# Patient Record
Sex: Male | Born: 2000 | Race: White | Hispanic: No | Marital: Single | Smoking: Never smoker
Health system: Southern US, Community
[De-identification: ages and names within clinical notes are randomized; demographics above are authoritative.]

---
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
## 2021-10-04 ENCOUNTER — Encounter (HOSPITAL_BASED_OUTPATIENT_CLINIC_OR_DEPARTMENT_OTHER): Payer: Self-pay | Admitting: Obstetrics and Gynecology

## 2021-10-04 ENCOUNTER — Emergency Department (HOSPITAL_BASED_OUTPATIENT_CLINIC_OR_DEPARTMENT_OTHER)
Admission: EM | Admit: 2021-10-04 | Discharge: 2021-10-04 | Disposition: A | Discharge status: Home / Self Care | Payer: BC Managed Care – PPO | Attending: Emergency Medicine | Admitting: Emergency Medicine

## 2021-10-04 ENCOUNTER — Emergency Department (HOSPITAL_BASED_OUTPATIENT_CLINIC_OR_DEPARTMENT_OTHER): Payer: BC Managed Care – PPO | Admitting: Radiology

## 2021-10-04 ENCOUNTER — Other Ambulatory Visit: Payer: Self-pay

## 2021-10-04 DIAGNOSIS — M533 Sacrococcygeal disorders, not elsewhere classified: Secondary | ICD-10-CM | POA: Diagnosis not present

## 2021-10-04 DIAGNOSIS — S30810A Abrasion of lower back and pelvis, initial encounter: Secondary | ICD-10-CM | POA: Diagnosis not present

## 2021-10-04 DIAGNOSIS — T148XXA Other injury of unspecified body region, initial encounter: Secondary | ICD-10-CM

## 2021-10-04 DIAGNOSIS — S80812A Abrasion, left lower leg, initial encounter: Secondary | ICD-10-CM | POA: Diagnosis not present

## 2021-10-04 DIAGNOSIS — F17210 Nicotine dependence, cigarettes, uncomplicated: Secondary | ICD-10-CM | POA: Insufficient documentation

## 2021-10-04 DIAGNOSIS — Y92488 Other paved roadways as the place of occurrence of the external cause: Secondary | ICD-10-CM | POA: Insufficient documentation

## 2021-10-04 MED ORDER — CEPHALEXIN 500 MG PO CAPS: 500.0000 mg | ORAL_CAPSULE | Freq: Two times a day (BID) | ORAL | 0 refills | Status: DC

## 2021-10-04 MED ORDER — OXYCODONE-ACETAMINOPHEN 5-325 MG PO TABS: 1.0000 | ORAL_TABLET | Freq: Four times a day (QID) | ORAL | 0 refills | Status: AC | PRN

## 2021-10-04 MED ORDER — CEPHALEXIN 500 MG PO CAPS: 500.0000 mg | ORAL_CAPSULE | Freq: Two times a day (BID) | ORAL | 0 refills | Status: AC

## 2021-10-04 MED ORDER — OXYCODONE-ACETAMINOPHEN 5-325 MG PO TABS
1.0000 | ORAL_TABLET | ORAL | Status: DC | PRN
  Administered 2021-10-04: 1 via ORAL
  Filled 2021-10-04: qty 1

## 2021-10-04 MED ORDER — CEPHALEXIN 250 MG PO CAPS: 500.0000 mg | ORAL_CAPSULE | Freq: Once | ORAL | Status: DC
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
## 2022-03-22 ENCOUNTER — Emergency Department (HOSPITAL_BASED_OUTPATIENT_CLINIC_OR_DEPARTMENT_OTHER): Payer: BC Managed Care – PPO | Admitting: Radiology

## 2022-03-22 ENCOUNTER — Other Ambulatory Visit: Payer: Self-pay

## 2022-03-22 ENCOUNTER — Encounter (HOSPITAL_BASED_OUTPATIENT_CLINIC_OR_DEPARTMENT_OTHER): Payer: Self-pay | Admitting: Emergency Medicine

## 2022-03-22 ENCOUNTER — Emergency Department (HOSPITAL_BASED_OUTPATIENT_CLINIC_OR_DEPARTMENT_OTHER)
Admission: EM | Admit: 2022-03-22 | Discharge: 2022-03-22 | Disposition: A | Discharge status: Home / Self Care | Payer: BC Managed Care – PPO | Attending: Emergency Medicine | Admitting: Emergency Medicine

## 2022-03-22 DIAGNOSIS — S9031XA Contusion of right foot, initial encounter: Secondary | ICD-10-CM | POA: Diagnosis not present

## 2022-03-22 DIAGNOSIS — M7989 Other specified soft tissue disorders: Secondary | ICD-10-CM | POA: Diagnosis not present

## 2022-03-22 DIAGNOSIS — S99921A Unspecified injury of right foot, initial encounter: Secondary | ICD-10-CM | POA: Diagnosis not present

## 2022-03-22 MED ORDER — IBUPROFEN 800 MG PO TABS: 800.0000 mg | ORAL_TABLET | Freq: Three times a day (TID) | ORAL | 0 refills | Status: AC

## 2022-03-22 MED ORDER — ACETAMINOPHEN 325 MG PO TABS: 650.0000 mg | ORAL_TABLET | Freq: Four times a day (QID) | ORAL | 0 refills | Status: AC | PRN

## 2022-03-22 MED ORDER — OXYCODONE HCL 5 MG PO TABS: 5.0000 mg | ORAL_TABLET | ORAL | 0 refills | Status: AC | PRN

## 2022-03-22 MED ORDER — HYDROCODONE-ACETAMINOPHEN 5-325 MG PO TABS
1.0000 | ORAL_TABLET | Freq: Once | ORAL | Status: AC
  Administered 2022-03-22: 1 via ORAL
  Filled 2022-03-22: qty 1
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
## 2022-03-22 NOTE — Discharge Instructions (Addendum)
It was a pleasure caring for you today in the emergency department. ° °Please return to the emergency department for any worsening or worrisome symptoms. ° ° °

## 2022-03-22 NOTE — ED Notes (Signed)
W
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
## 2022-03-22 NOTE — ED Notes (Signed)
Dc instructions reviewed with patient. Patient voiced understanding. Dc with belongings.  °

## 2022-03-22 NOTE — ED Notes (Signed)
Pt was able to walk around with post op shoe. Pt did state that it was painful to apply pressure but would be able to do it. ?

## 2022-03-28 DIAGNOSIS — M25571 Pain in right ankle and joints of right foot: Secondary | ICD-10-CM | POA: Diagnosis not present

## 2023-02-10 DIAGNOSIS — I10 Essential (primary) hypertension: Secondary | ICD-10-CM | POA: Diagnosis not present

## 2023-02-10 DIAGNOSIS — F419 Anxiety disorder, unspecified: Secondary | ICD-10-CM | POA: Diagnosis not present

## 2023-02-10 DIAGNOSIS — G43009 Migraine without aura, not intractable, without status migrainosus: Secondary | ICD-10-CM | POA: Diagnosis not present

## 2023-02-10 DIAGNOSIS — R5383 Other fatigue: Secondary | ICD-10-CM | POA: Diagnosis not present

## 2023-03-02 ENCOUNTER — Encounter (HOSPITAL_COMMUNITY): Payer: Self-pay

## 2023-03-02 ENCOUNTER — Emergency Department (HOSPITAL_COMMUNITY)
Admission: EM | Admit: 2023-03-02 | Discharge: 2023-03-02 | Disposition: A | Discharge status: Home / Self Care | Payer: BC Managed Care – PPO | Attending: Emergency Medicine | Admitting: Emergency Medicine

## 2023-03-02 ENCOUNTER — Other Ambulatory Visit: Payer: Self-pay

## 2023-03-02 ENCOUNTER — Emergency Department (HOSPITAL_COMMUNITY): Payer: BC Managed Care – PPO

## 2023-03-02 DIAGNOSIS — K2920 Alcoholic gastritis without bleeding: Secondary | ICD-10-CM

## 2023-03-02 DIAGNOSIS — R109 Unspecified abdominal pain: Secondary | ICD-10-CM | POA: Diagnosis not present

## 2023-03-02 DIAGNOSIS — N3289 Other specified disorders of bladder: Secondary | ICD-10-CM | POA: Diagnosis not present

## 2023-03-02 LAB — URINALYSIS, ROUTINE W REFLEX MICROSCOPIC
Bilirubin Urine: NEGATIVE
Glucose, UA: NEGATIVE mg/dL
Hgb urine dipstick: NEGATIVE
Ketones, ur: NEGATIVE mg/dL
Leukocytes,Ua: NEGATIVE
Nitrite: NEGATIVE
Protein, ur: NEGATIVE mg/dL
Specific Gravity, Urine: <1.005 — ABNORMAL LOW (ref 1.005–1.030)
pH: 7 (ref 5.0–8.0)

## 2023-03-02 LAB — COMPREHENSIVE METABOLIC PANEL
ALT: 43 U/L (ref 0–44)
AST: 38 U/L (ref 15–41)
Albumin: 4.6 g/dL (ref 3.5–5.0)
Alkaline Phosphatase: 105 U/L (ref 38–126)
Anion gap: 11 (ref 5–15)
BUN: 11 mg/dL (ref 6–20)
CO2: 23 mmol/L (ref 22–32)
Calcium: 9.4 mg/dL (ref 8.9–10.3)
Chloride: 100 mmol/L (ref 98–111)
Creatinine, Ser: 0.93 mg/dL (ref 0.61–1.24)
GFR, Estimated: >60 mL/min (ref >60)
Glucose, Bld: 107 mg/dL — ABNORMAL HIGH (ref 70–99)
Potassium: 3.9 mmol/L (ref 3.5–5.1)
Sodium: 134 mmol/L — ABNORMAL LOW (ref 135–145)
Total Bilirubin: 0.4 mg/dL (ref 0.3–1.2)
Total Protein: 8.5 g/dL — ABNORMAL HIGH (ref 6.5–8.1)

## 2023-03-02 LAB — CBC WITH DIFFERENTIAL/PLATELET
Abs Immature Granulocytes: 0.03 10*3/uL (ref 0.00–0.07)
Basophils Absolute: 0.1 10*3/uL (ref 0.0–0.1)
Basophils Relative: 1 %
Eosinophils Absolute: 0.3 10*3/uL (ref 0.0–0.5)
Eosinophils Relative: 3 %
HCT: 48.6 % (ref 39.0–52.0)
Hemoglobin: 15.9 g/dL (ref 13.0–17.0)
Immature Granulocytes: 0 %
Lymphocytes Relative: 18 %
Lymphs Abs: 1.9 10*3/uL (ref 0.7–4.0)
MCH: 28.3 pg (ref 26.0–34.0)
MCHC: 32.7 g/dL (ref 30.0–36.0)
MCV: 86.5 fL (ref 80.0–100.0)
Monocytes Absolute: 0.8 10*3/uL (ref 0.1–1.0)
Monocytes Relative: 8 %
Neutro Abs: 7.2 10*3/uL (ref 1.7–7.7)
Neutrophils Relative %: 70 %
Platelets: 307 10*3/uL (ref 150–400)
RBC: 5.62 MIL/uL (ref 4.22–5.81)
RDW: 13.7 % (ref 11.5–15.5)
WBC: 10.3 10*3/uL (ref 4.0–10.5)
nRBC: 0 % (ref 0.0–0.2)

## 2023-03-02 LAB — LIPASE, BLOOD: Lipase: 46 U/L (ref 11–51)

## 2023-03-02 MED ORDER — FAMOTIDINE 20 MG PO TABS: 20.0000 mg | ORAL_TABLET | Freq: Two times a day (BID) | ORAL | 0 refills | Status: AC

## 2023-03-02 MED ORDER — ONDANSETRON 4 MG PO TBDP: 4.0000 mg | ORAL_TABLET | Freq: Three times a day (TID) | ORAL | 1 refills | Status: AC | PRN

## 2023-03-02 MED ORDER — LIDOCAINE VISCOUS HCL 2 % MT SOLN
15.0000 mL | Freq: Once | OROMUCOSAL | Status: AC
  Administered 2023-03-02: 15 mL via ORAL
  Filled 2023-03-02: qty 15

## 2023-03-02 MED ORDER — MORPHINE SULFATE (PF) 2 MG/ML IV SOLN
2.0000 mg | Freq: Once | INTRAVENOUS | Status: AC
  Administered 2023-03-02: 2 mg via INTRAVENOUS
  Filled 2023-03-02: qty 1

## 2023-03-02 MED ORDER — PANTOPRAZOLE SODIUM 40 MG IV SOLR
40.0000 mg | Freq: Once | INTRAVENOUS | Status: AC
  Administered 2023-03-02: 40 mg via INTRAVENOUS
  Filled 2023-03-02: qty 10

## 2023-03-02 MED ORDER — KETOROLAC TROMETHAMINE 15 MG/ML IJ SOLN
15.0000 mg | Freq: Once | INTRAMUSCULAR | Status: AC
  Administered 2023-03-02: 15 mg via INTRAVENOUS
  Filled 2023-03-02: qty 1

## 2023-03-02 MED ORDER — IOHEXOL 300 MG/ML  SOLN
100.0000 mL | Freq: Once | INTRAMUSCULAR | Status: AC | PRN
  Administered 2023-03-02: 100 mL via INTRAVENOUS

## 2023-03-02 MED ORDER — ALUM & MAG HYDROXIDE-SIMETH 200-200-20 MG/5ML PO SUSP
30.0000 mL | Freq: Once | ORAL | Status: AC
  Administered 2023-03-02: 30 mL via ORAL
  Filled 2023-03-02: qty 30

## 2023-03-02 MED ORDER — ACETAMINOPHEN 500 MG PO TABS
1000.0000 mg | ORAL_TABLET | Freq: Once | ORAL | Status: AC
  Administered 2023-03-02: 1000 mg via ORAL
  Filled 2023-03-02: qty 2

## 2023-03-02 NOTE — ED Provider Notes (Signed)
[facility] Provider Note   CSN: [id number] Arrival date & time: 03/02/23  9629     History  Chief Complaint  Patient presents with   Abdominal Pain   Emesis    Isaac Romero is a 22 y.o. male with eustachian tube dysfunction, chronic pansinusitis who presents with abd pain.   Pt presents with c/o vomiting and abdominal pain. Pt reports that he normally drinks every day and he stopped several days ago, one day prior to when he began feeling sick.  He has diffuse abdominal pain located in bilateral lower quadrants but left possibly greater than right.  He has had no associated urinary symptoms, no diarrhea/constipation, no melena/hematochezia.  No fever/chills.  Does not notice that the pain is worse with eating.  No radiation of the pain to his back.  No history of similar pain.    Abdominal Pain Associated symptoms: vomiting   Emesis Associated symptoms: abdominal pain        Home Medications Prior to Admission medications   Medication Sig Start Date End Date Taking? Authorizing Provider  famotidine (PEPCID) 20 MG tablet Take 1 tablet (20 mg total) by mouth 2 (two) times daily for 7 days. 03/02/23 03/09/23 Yes Fredia Sorrow, MD  ondansetron (ZOFRAN-ODT) 4 MG disintegrating tablet Take 1 tablet (4 mg total) by mouth every 8 (eight) hours as needed for nausea or vomiting. 03/02/23  Yes Fredia Sorrow, MD  acetaminophen (TYLENOL) 325 MG tablet Take 2 tablets (650 mg total) by mouth every 6 (six) hours as needed. 03/22/22   Jeanell Sparrow, DO  clotrimazole (MYCELEX) 10 MG troche Take 1 tablet (10 mg total) by mouth 5 (five) times daily. Dissolve on tongue 5x daily for 7 days 12/27/15   Harlene Salts, MD  ibuprofen (ADVIL) 800 MG tablet Take 1 tablet (800 mg total) by mouth 3 (three) times daily. 03/22/22   Jeanell Sparrow, DO  oxyCODONE (ROXICODONE) 5 MG immediate release tablet Take 1 tablet (5 mg total) by mouth every 4 (four) hours as needed  for up to 5 doses for severe pain or breakthrough pain. 03/22/22   Jeanell Sparrow, DO  oxyCODONE-acetaminophen (PERCOCET/ROXICET) 5-325 MG tablet Take 1 tablet by mouth every 6 (six) hours as needed for up to 15 doses for severe pain. 10/04/21   Lennice Sites, DO      Allergies    Patient has no known allergies.    Review of Systems   Review of Systems  Gastrointestinal:  Positive for abdominal pain and vomiting.   Review of systems Negative for f/c.  A 10 point review of systems was performed and is negative unless otherwise reported in HPI.  Physical Exam Updated Vital Signs BP 138/82 (BP Location: Right Arm)   Pulse 60   Temp 98.5 F (36.9 C) (Oral)   Resp 18   SpO2 99%  Physical Exam General: Normal appearing male, lying in bed.  HEENT: Sclera anicteric, MMM, trachea midline.  Cardiology: RRR, no murmurs/rubs/gallops. BL radial and DP pulses equal bilaterally.  Resp: Normal respiratory rate and effort. CTAB, no wheezes, rhonchi, crackles.  Abd: Mildly diffusely tender in lower quadrants, L>R; mild epigastric TTP. Soft,  non-distended. No rebound tenderness or guarding.  GU: Deferred. MSK: No peripheral edema or signs of trauma. Extremities without deformity or TTP. No cyanosis or clubbing. Skin: warm, dry. No rashes or lesions. Back: No CVA tenderness Neuro: A&Ox4, CNs II-XII grossly intact. MAEs. Sensation grossly intact.  Psych:  Normal mood and affect.   ED Results / Procedures / Treatments   Labs (all labs ordered are listed, but only abnormal results are displayed) Labs Reviewed  COMPREHENSIVE METABOLIC PANEL - Abnormal; Notable for the following components:      Result Value   Sodium 134 (*)    Glucose, Bld 107 (*)    Total Protein 8.5 (*)    All other components within normal limits  URINALYSIS, ROUTINE W REFLEX MICROSCOPIC - Abnormal; Notable for the following components:   Specific Gravity, Urine <1.005 (*)    All other components within normal limits  CBC  WITH DIFFERENTIAL/PLATELET  LIPASE, BLOOD    EKG None  Radiology CT A/P Pending  Procedures Procedures    Medications Ordered in ED Medications  alum & mag hydroxide-simeth (MAALOX/MYLANTA) 200-200-20 MG/5ML suspension 30 mL (30 mLs Oral Given 03/02/23 1003)    And  lidocaine (XYLOCAINE) 2 % viscous mouth solution 15 mL (15 mLs Oral Given 03/02/23 1003)  acetaminophen (TYLENOL) tablet 1,000 mg (1,000 mg Oral Given 03/02/23 1003)  morphine (PF) 2 MG/ML injection 2 mg (2 mg Intravenous Given 03/02/23 1104)  ketorolac (TORADOL) 15 MG/ML injection 15 mg (15 mg Intravenous Given 03/02/23 1103)  iohexol (OMNIPAQUE) 300 MG/ML solution 100 mL (100 mLs Intravenous Contrast Given 03/02/23 1502)  pantoprazole (PROTONIX) injection 40 mg (40 mg Intravenous Given 03/02/23 1552)    ED Course/ Medical Decision Making/ A&P                          Medical Decision Making Amount and/or Complexity of Data Reviewed Labs: ordered. Decision-making details documented in ED Course. Radiology: ordered. Decision-making details documented in ED Course.  Risk OTC drugs. Prescription drug management.    This patient presents to the ED for concern of abdominal pain; this involves an extensive number of treatment options, and is a complaint that carries with it a high risk of complications and morbidity.  I considered the following differential and admission for this acute, potentially life threatening condition.   MDM:    For DDX for abdominal pain includes but is not limited to:  Abdominal exam without peritoneal signs. No evidence of acute abdomen at this time. Low suspicion for acute hepatobiliary disease (including acute cholecystitis or cholangitis) with no RUQ TTP. Consider acute pancreatitis given h/o EtOH use; also consider PUD/alcoholic gastritis. Lower c/f acute appendicitis, vascular catastrophe, bowel obstruction, viscus perforation, diverticulitis given patient's lack of findings of acute abdomen on  exam, age, and overall well-appearing, otherwise healthy patient. Will start with labs and pain medications and reassess.    Clinical Course as of 03/07/23 1305  Thu Mar 02, 2023  1027 Lipase: 46 Neg [HN]  1027 Comprehensive metabolic panel(!) Largely unremarkalbe, very mild hyponatremia [HN]  1027 CBC with Differential wnl [HN]  1310 D/w patient who still has persistent LLQ pain and relays that he was concenred about possible UC. States his father has a diagnosis of UC and that was why he came in. Does request a CT scan. Will perform CT scan w/ contrast.  [HN]  1538 CT ABDOMEN PELVIS W CONTRAST 1. Mild wall thickening versus underdistention of the gastric antrum. Correlate for gastritis. 2. Prominent ileocolic lymph nodes measure up to 6 mm in short axis, nonspecific favored reactive but can be seen in the setting of mesenteric adenitis. 3. Mild symmetric wall thickening of a nondistended urinary bladder, which may be due to underdistention but can also be  seen in the setting of cystitis. Correlate with urinalysis.   [HN]  1602 Patient is given IV protonix for likely alcoholic gastritis. CT also demonstrated c/f cystitis, will get UA. Patient is signed out to the oncoming ED physician Dr. Rogene Houston who is made aware of his history, presentation, exam, workup, and plan.   [HN]    Clinical Course User Index [HN] Audley Hose, MD    Labs: I Ordered, and personally interpreted labs.  The pertinent results include: Those listed above  Imaging Studies ordered: I ordered imaging studies including CT abdomen pelvis I independently visualized and interpreted imaging. I agree with the radiologist interpretation  Additional history obtained from mother at bedside.   Reevaluation: After the interventions noted above, I reevaluated the patient and found that they have :improved  Social Determinants of Health: Patient lives independently   Disposition:  Likely DC once UA  returns.  Co morbidities that complicate the patient evaluation History reviewed. No pertinent past medical history.   Medicines Meds ordered this encounter  Medications   AND Linked Order Group    alum & mag hydroxide-simeth (MAALOX/MYLANTA) 200-200-20 MG/5ML suspension 30 mL    lidocaine (XYLOCAINE) 2 % viscous mouth solution 15 mL   acetaminophen (TYLENOL) tablet 1,000 mg   morphine (PF) 2 MG/ML injection 2 mg   ketorolac (TORADOL) 15 MG/ML injection 15 mg   iohexol (OMNIPAQUE) 300 MG/ML solution 100 mL   pantoprazole (PROTONIX) injection 40 mg   famotidine (PEPCID) 20 MG tablet    Sig: Take 1 tablet (20 mg total) by mouth 2 (two) times daily for 7 days.    Dispense:  14 tablet    Refill:  0   ondansetron (ZOFRAN-ODT) 4 MG disintegrating tablet    Sig: Take 1 tablet (4 mg total) by mouth every 8 (eight) hours as needed for nausea or vomiting.    Dispense:  12 tablet    Refill:  1    I have reviewed the patients home medicines and have made adjustments as needed  Problem List / ED Course: Problem List Items Addressed This Visit   None Visit Diagnoses     Acute alcoholic gastritis without hemorrhage    -  Primary                   This note was created using dictation software, which may contain spelling or grammatical errors.    Audley Hose, MD 03/07/23 1308

## 2023-03-02 NOTE — ED Provider Notes (Addendum)
Urinalysis negative for urinary tract infection.  Patient stable for discharge home.  Patient states he is feeling better.  But not completely back to normal.  Will treat with Zofran ODT and have him continue with Pepcid for the possible alcohol gastritis.   At discharge patient was requesting narcotic pain medicine.  Do not feel that is appropriate for his presentation.  Did recommend extra strength Tylenol.  If patient takes the Pepcid as directed should help improve the inflammation in the abdomen and should help with the pain.  Fredia Sorrow, MD 03/02/23 1759    Fredia Sorrow, MD 03/02/23 1801    Fredia Sorrow, MD 03/02/23 1827

## 2023-03-02 NOTE — ED Triage Notes (Signed)
Pt presents with c/o vomiting and abdominal pain. Pt reports that he normally drinks every day and he stopped several days ago, one day prior to when he began feeling sick.

## 2023-03-02 NOTE — Discharge Instructions (Addendum)
Urinalysis negative.  No signs of urinary tract infection.  Take the Zofran as needed for nausea vomiting.  Take the Pepcid for the next 7 days.  Return for any new or worse symptoms.

## 2023-03-09 DIAGNOSIS — G4700 Insomnia, unspecified: Secondary | ICD-10-CM | POA: Diagnosis not present

## 2023-03-09 DIAGNOSIS — G4719 Other hypersomnia: Secondary | ICD-10-CM | POA: Diagnosis not present

## 2023-03-21 DIAGNOSIS — F419 Anxiety disorder, unspecified: Secondary | ICD-10-CM | POA: Diagnosis not present

## 2023-03-21 DIAGNOSIS — I10 Essential (primary) hypertension: Secondary | ICD-10-CM | POA: Diagnosis not present

## 2023-03-21 DIAGNOSIS — F331 Major depressive disorder, recurrent, moderate: Secondary | ICD-10-CM | POA: Diagnosis not present

## 2023-03-21 DIAGNOSIS — R000 Tachycardia, unspecified: Secondary | ICD-10-CM | POA: Diagnosis not present

## 2023-04-21 DIAGNOSIS — F331 Major depressive disorder, recurrent, moderate: Secondary | ICD-10-CM | POA: Diagnosis not present

## 2023-04-21 DIAGNOSIS — I10 Essential (primary) hypertension: Secondary | ICD-10-CM | POA: Diagnosis not present

## 2023-04-21 DIAGNOSIS — F419 Anxiety disorder, unspecified: Secondary | ICD-10-CM | POA: Diagnosis not present
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
## 2023-11-30 DIAGNOSIS — H6691 Otitis media, unspecified, right ear: Secondary | ICD-10-CM | POA: Diagnosis not present

## 2023-11-30 DIAGNOSIS — J029 Acute pharyngitis, unspecified: Secondary | ICD-10-CM | POA: Diagnosis not present

## 2023-11-30 DIAGNOSIS — R051 Acute cough: Secondary | ICD-10-CM | POA: Diagnosis not present
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
## 2024-05-11 DIAGNOSIS — J101 Influenza due to other identified influenza virus with other respiratory manifestations: Secondary | ICD-10-CM | POA: Diagnosis not present

## 2024-05-11 DIAGNOSIS — Z720 Tobacco use: Secondary | ICD-10-CM | POA: Diagnosis not present

## 2024-05-11 DIAGNOSIS — R059 Cough, unspecified: Secondary | ICD-10-CM | POA: Diagnosis not present

## 2024-05-11 DIAGNOSIS — R0981 Nasal congestion: Secondary | ICD-10-CM | POA: Diagnosis not present

## 2024-05-11 DIAGNOSIS — J029 Acute pharyngitis, unspecified: Secondary | ICD-10-CM | POA: Diagnosis not present

## 2024-05-11 DIAGNOSIS — Z20822 Contact with and (suspected) exposure to covid-19: Secondary | ICD-10-CM | POA: Diagnosis not present

## 2024-05-11 DIAGNOSIS — R6883 Chills (without fever): Secondary | ICD-10-CM | POA: Diagnosis not present

## 2025-01-17 ENCOUNTER — Ambulatory Visit: Admitting: Physician Assistant

## 2025-01-27 ENCOUNTER — Ambulatory Visit: Payer: Self-pay

## 2025-01-27 ENCOUNTER — Ambulatory Visit: Admitting: Physician Assistant

## 2025-02-07 ENCOUNTER — Ambulatory Visit: Admitting: Physician Assistant
